# Patient Record
Sex: Male | Born: 1960 | Race: White | Hispanic: No | Marital: Married
Health system: Southern US, Community
[De-identification: ages and names within clinical notes are randomized; demographics above are authoritative.]

---

## 2002-06-17 ENCOUNTER — Ambulatory Visit (HOSPITAL_BASED_OUTPATIENT_CLINIC_OR_DEPARTMENT_OTHER): Admission: RE | Admit: 2002-06-17 | Discharge: 2002-06-17 | Payer: Self-pay | Admitting: Urology

## 2012-01-01 ENCOUNTER — Other Ambulatory Visit: Payer: Self-pay | Admitting: Gastroenterology

## 2013-11-16 ENCOUNTER — Ambulatory Visit (INDEPENDENT_AMBULATORY_CARE_PROVIDER_SITE_OTHER): Payer: BC Managed Care – PPO

## 2013-11-16 ENCOUNTER — Ambulatory Visit (INDEPENDENT_AMBULATORY_CARE_PROVIDER_SITE_OTHER): Payer: BC Managed Care – PPO | Admitting: Podiatry

## 2013-11-16 DIAGNOSIS — M1A1710 Lead-induced chronic gout, right ankle and foot, without tophus (tophi): Secondary | ICD-10-CM

## 2013-11-16 DIAGNOSIS — M7751 Other enthesopathy of right foot: Secondary | ICD-10-CM | POA: Diagnosis not present

## 2013-11-16 DIAGNOSIS — M10371 Gout due to renal impairment, right ankle and foot: Secondary | ICD-10-CM | POA: Diagnosis not present

## 2013-11-16 DIAGNOSIS — M779 Enthesopathy, unspecified: Secondary | ICD-10-CM | POA: Diagnosis not present

## 2013-11-16 DIAGNOSIS — T560X1A Toxic effect of lead and its compounds, accidental (unintentional), initial encounter: Secondary | ICD-10-CM | POA: Diagnosis not present

## 2013-11-16 LAB — C-REACTIVE PROTEIN: CRP: 1.3 mg/dL — ABNORMAL HIGH (ref <0.60)

## 2013-11-16 LAB — URIC ACID: Uric Acid, Serum: 8.5 mg/dL — ABNORMAL HIGH (ref 4.0–7.8)

## 2013-11-16 LAB — RHEUMATOID FACTOR: Rhuematoid fact SerPl-aCnc: <10 IU/mL (ref <=14)

## 2013-11-16 MED ORDER — TRIAMCINOLONE ACETONIDE 10 MG/ML IJ SUSP
10.0000 mg | Freq: Once | INTRAMUSCULAR | Status: AC
  Administered 2013-11-16: 10 mg

## 2013-11-16 MED ORDER — PREDNISONE 10 MG PO TABS: ORAL_TABLET | ORAL | Status: DC

## 2013-11-16 NOTE — Patient Instructions (Signed)

## 2013-11-16 NOTE — Progress Notes (Signed)
Subjective:     Patient ID: Mario Lopez, male   DOB: 01/14/1961, 53 y.o.   MRN: [id number]  Foot Pain   patient states that I have had severe pain in my arch in the outside of my right foot for the last 3 days. I've had numerous small attacks of gout that I use a steroid for this is been very bad   Review of Systems  All other systems reviewed and are negative.      Objective:   Physical Exam  Nursing note and vitals reviewed. Constitutional: He is oriented to person, place, and time.  Cardiovascular: Intact distal pulses.   Musculoskeletal: Normal range of motion.  Neurological: He is oriented to person, place, and time.  Skin: Skin is warm.   neurovascular status intact with muscle strength adequate and range of motion subtalar and midtarsal joint within normal limits. Patient is noted to have discomfort in the outside of the right foot of an intense nature and discomfort in the right arch was swelling noted within the arch itself. I noted redness on the outside of the right foot     Assessment:     Acute gout attack right lateral foot and into the right arch which is extremely painful when palpated    Plan:     H&P and x-ray reviewed. Injected the lateral side of the foot 3 mg Kenalog 5 mg I can Marcaine mixture instructed on anti-inflammatory and placed on 12 a Medrol Dosepak and also ordered blood work to see where the uric acid level is. Discussed that he may ultimately need to see a rheumatologist

## 2013-11-16 NOTE — Progress Notes (Signed)
   Subjective:    Patient ID: Mario Lopez, male    DOB: 02/29/1960, 53 y.o.   MRN: [id number]  HPI Comments: "I have a painful foot"  Patient c/o severe aching dorsal and arch right since Sunday. The foot is red and swollen. Worst is the mornings. He has had episodes like this for the past year. No home treatment.  Foot Pain Associated symptoms include arthralgias.      Review of Systems  Musculoskeletal: Positive for arthralgias and gait problem.  All other systems reviewed and are negative.      Objective:   Physical Exam        Assessment & Plan:

## 2013-11-17 LAB — SEDIMENTATION RATE: Sed Rate: 4 mm/hr (ref 0–16)

## 2013-11-17 LAB — ANA: Anti Nuclear Antibody(ANA): NEGATIVE

## 2013-11-24 ENCOUNTER — Ambulatory Visit (INDEPENDENT_AMBULATORY_CARE_PROVIDER_SITE_OTHER): Payer: BC Managed Care – PPO | Admitting: Podiatry

## 2013-11-24 VITALS — BP 125/78 | HR 74 | Resp 16

## 2013-11-24 DIAGNOSIS — M779 Enthesopathy, unspecified: Secondary | ICD-10-CM

## 2013-11-24 DIAGNOSIS — M10371 Gout due to renal impairment, right ankle and foot: Secondary | ICD-10-CM

## 2013-11-27 NOTE — Progress Notes (Signed)
Subjective:     Patient ID: Mario Lopez, male   DOB: 12/16/1960, 53 y.o.   MRN: [id number]  HPI patient presents stating the outside of my right foot feels quite a bit better and I'm able to ambulate without pain   Review of Systems     Objective:   Physical Exam Neurovascular status intact with muscle strength adequate and no range of motion changes and noted to have improving right lateral foot    Assessment:     Tendinitis of the right lateral foot with probable gout and inflammation    Plan:     Educated again on gout and if packs were to recur we will need to consider long-term medication and today I wrote him out a sheet concerning diet and modification. He will be seen back with any

## 2015-04-25 ENCOUNTER — Telehealth: Payer: Self-pay | Admitting: *Deleted

## 2015-04-25 NOTE — Telephone Encounter (Addendum)
Pt left name, DOB and phone number.  Left message informing pt we would need him to make an appt, since his last visit was 11/2013, and we would need to have his problem and health status updated.  04/26/2015-I spoke with pt and he states he is out-of-town and Dr. Charlsie Merlesegal writes prednisone pack for his gouty ankle when he calls.  Dr. Charlsie Merlesegal ordered refill the Prednisone as previously.  Done and informed pt.

## 2015-04-26 MED ORDER — PREDNISONE 10 MG PO TABS: ORAL_TABLET | ORAL | Status: DC

## 2015-04-26 NOTE — Telephone Encounter (Signed)
That's fine for him to have

## 2015-05-31 DIAGNOSIS — G43A1 Cyclical vomiting, intractable: Secondary | ICD-10-CM | POA: Diagnosis not present

## 2015-05-31 DIAGNOSIS — R1013 Epigastric pain: Secondary | ICD-10-CM | POA: Diagnosis not present

## 2015-05-31 DIAGNOSIS — R197 Diarrhea, unspecified: Secondary | ICD-10-CM | POA: Diagnosis not present

## 2015-06-14 DIAGNOSIS — B2780 Other infectious mononucleosis without complication: Secondary | ICD-10-CM | POA: Diagnosis not present

## 2015-06-14 DIAGNOSIS — J028 Acute pharyngitis due to other specified organisms: Secondary | ICD-10-CM | POA: Diagnosis not present

## 2015-10-23 DIAGNOSIS — Z125 Encounter for screening for malignant neoplasm of prostate: Secondary | ICD-10-CM | POA: Diagnosis not present

## 2015-10-23 DIAGNOSIS — Z0000 Encounter for general adult medical examination without abnormal findings: Secondary | ICD-10-CM | POA: Diagnosis not present

## 2016-04-21 DIAGNOSIS — N183 Chronic kidney disease, stage 3 (moderate): Secondary | ICD-10-CM | POA: Diagnosis not present

## 2016-05-23 DIAGNOSIS — R809 Proteinuria, unspecified: Secondary | ICD-10-CM | POA: Diagnosis not present

## 2016-05-23 DIAGNOSIS — T464X5A Adverse effect of angiotensin-converting-enzyme inhibitors, initial encounter: Secondary | ICD-10-CM | POA: Diagnosis not present

## 2016-05-23 DIAGNOSIS — N183 Chronic kidney disease, stage 3 (moderate): Secondary | ICD-10-CM | POA: Diagnosis not present

## 2016-06-13 ENCOUNTER — Telehealth: Payer: Self-pay | Admitting: Podiatry

## 2016-06-13 ENCOUNTER — Ambulatory Visit (INDEPENDENT_AMBULATORY_CARE_PROVIDER_SITE_OTHER): Payer: BLUE CROSS/BLUE SHIELD | Admitting: Podiatry

## 2016-06-13 DIAGNOSIS — M779 Enthesopathy, unspecified: Secondary | ICD-10-CM

## 2016-06-13 MED ORDER — TRIAMCINOLONE ACETONIDE 10 MG/ML IJ SUSP
10.0000 mg | Freq: Once | INTRAMUSCULAR | Status: AC
  Administered 2016-06-13: 10 mg

## 2016-06-13 MED ORDER — METHYLPREDNISOLONE 4 MG PO TBPK: ORAL_TABLET | ORAL | 0 refills | Status: DC

## 2016-06-13 NOTE — Telephone Encounter (Signed)
Left message informing pt his LOV 11/2013, and we would need for him to make an appt prior to medication refills.

## 2016-06-13 NOTE — Telephone Encounter (Signed)
Pt called and is having a gout flare up and asking if he could get rx refill on his prednisone or does he need an appt. Pt last seen  10.15 2015

## 2016-06-14 NOTE — Progress Notes (Signed)
Subjective:    Patient ID: Mario Lopez, male   DOB: 56 y.o.   MRN: [id number]   HPI patient presents with flareup of his left lateral ankle stating he's been getting this around once a year and is going to [place]    ROS      Objective:  Physical Exam Neurovascular status intact with patient found to have lateral inflammation of the peroneal tendon group that is seems to be where he gets his gout and it is inflamed and tender    Assessment:    Lateral tendinitis secondary to inflammatory condition with pain     Plan:     H&P I went ahead today and I injected the lateral tendon group 3 mg Kenalog 5 mg Xylocaine and instructed on ice therapy and wrote for a five-day

## 2016-06-23 DIAGNOSIS — N183 Chronic kidney disease, stage 3 (moderate): Secondary | ICD-10-CM | POA: Diagnosis not present

## 2016-09-03 ENCOUNTER — Telehealth: Payer: Self-pay | Admitting: Podiatry

## 2016-09-03 MED ORDER — METHYLPREDNISOLONE 4 MG PO TBPK: ORAL_TABLET | ORAL | 0 refills | Status: AC

## 2016-09-03 NOTE — Telephone Encounter (Signed)
Pt called requesting a refill of prednisone for gout.

## 2016-09-03 NOTE — Telephone Encounter (Signed)
Dr. Charlsie Merlesegal ordered refill the Medrol dose pack as previously. Left message with Dr. Beverlee Nimsegal's orders.

## 2016-09-03 NOTE — Telephone Encounter (Signed)
Calling to see if Dr. Charlsie Merlesegal would prescribe additional prednisone as I'm about to go out of the country on a trip in which I will be doing a lot of walking. The prednisone really helps with my gout flair ups/attacks. You can reach me at [phone].

## 2017-01-23 DIAGNOSIS — M25531 Pain in right wrist: Secondary | ICD-10-CM | POA: Diagnosis not present

## 2017-09-04 DIAGNOSIS — M109 Gout, unspecified: Secondary | ICD-10-CM | POA: Diagnosis not present

## 2017-09-04 DIAGNOSIS — M7551 Bursitis of right shoulder: Secondary | ICD-10-CM | POA: Diagnosis not present

## 2017-09-04 DIAGNOSIS — G5601 Carpal tunnel syndrome, right upper limb: Secondary | ICD-10-CM | POA: Diagnosis not present

## 2017-09-04 DIAGNOSIS — G5611 Other lesions of median nerve, right upper limb: Secondary | ICD-10-CM | POA: Diagnosis not present

## 2017-09-25 DIAGNOSIS — M5410 Radiculopathy, site unspecified: Secondary | ICD-10-CM | POA: Diagnosis not present

## 2017-09-25 DIAGNOSIS — M7551 Bursitis of right shoulder: Secondary | ICD-10-CM | POA: Diagnosis not present

## 2017-09-25 DIAGNOSIS — Z6829 Body mass index (BMI) 29.0-29.9, adult: Secondary | ICD-10-CM | POA: Diagnosis not present

## 2017-09-25 DIAGNOSIS — Z23 Encounter for immunization: Secondary | ICD-10-CM | POA: Diagnosis not present

## 2017-11-10 ENCOUNTER — Other Ambulatory Visit: Payer: Self-pay | Admitting: Internal Medicine

## 2017-11-10 DIAGNOSIS — Z125 Encounter for screening for malignant neoplasm of prostate: Secondary | ICD-10-CM | POA: Diagnosis not present

## 2017-11-10 DIAGNOSIS — Z23 Encounter for immunization: Secondary | ICD-10-CM | POA: Diagnosis not present

## 2017-11-10 DIAGNOSIS — R202 Paresthesia of skin: Secondary | ICD-10-CM

## 2017-11-10 DIAGNOSIS — L219 Seborrheic dermatitis, unspecified: Secondary | ICD-10-CM | POA: Diagnosis not present

## 2017-11-10 DIAGNOSIS — N183 Chronic kidney disease, stage 3 (moderate): Secondary | ICD-10-CM | POA: Diagnosis not present

## 2017-11-10 DIAGNOSIS — Z0000 Encounter for general adult medical examination without abnormal findings: Secondary | ICD-10-CM | POA: Diagnosis not present

## 2017-11-10 DIAGNOSIS — Z13220 Encounter for screening for lipoid disorders: Secondary | ICD-10-CM | POA: Diagnosis not present

## 2017-11-12 ENCOUNTER — Ambulatory Visit
Admission: RE | Admit: 2017-11-12 | Discharge: 2017-11-12 | Disposition: A | Discharge status: Home / Self Care | Payer: Self-pay | Source: Ambulatory Visit | Attending: Internal Medicine | Admitting: Internal Medicine

## 2017-11-12 DIAGNOSIS — M542 Cervicalgia: Secondary | ICD-10-CM | POA: Diagnosis not present

## 2017-11-12 DIAGNOSIS — R202 Paresthesia of skin: Secondary | ICD-10-CM

## 2017-12-01 DIAGNOSIS — M5412 Radiculopathy, cervical region: Secondary | ICD-10-CM | POA: Diagnosis not present

## 2017-12-16 DIAGNOSIS — M5412 Radiculopathy, cervical region: Secondary | ICD-10-CM | POA: Diagnosis not present

## 2017-12-23 DIAGNOSIS — M5412 Radiculopathy, cervical region: Secondary | ICD-10-CM | POA: Diagnosis not present

## 2017-12-28 DIAGNOSIS — M5412 Radiculopathy, cervical region: Secondary | ICD-10-CM | POA: Diagnosis not present

## 2018-01-04 DIAGNOSIS — M5412 Radiculopathy, cervical region: Secondary | ICD-10-CM | POA: Diagnosis not present

## 2018-01-18 DIAGNOSIS — M5412 Radiculopathy, cervical region: Secondary | ICD-10-CM | POA: Diagnosis not present

## 2018-01-19 DIAGNOSIS — Z6827 Body mass index (BMI) 27.0-27.9, adult: Secondary | ICD-10-CM | POA: Diagnosis not present

## 2018-01-19 DIAGNOSIS — M542 Cervicalgia: Secondary | ICD-10-CM | POA: Diagnosis not present

## 2018-01-19 DIAGNOSIS — I10 Essential (primary) hypertension: Secondary | ICD-10-CM | POA: Diagnosis not present

## 2020-05-17 ENCOUNTER — Encounter: Payer: Self-pay | Admitting: Podiatry

## 2020-05-17 ENCOUNTER — Other Ambulatory Visit: Payer: Self-pay

## 2020-05-17 ENCOUNTER — Ambulatory Visit (INDEPENDENT_AMBULATORY_CARE_PROVIDER_SITE_OTHER): Payer: BC Managed Care – PPO | Admitting: Podiatry

## 2020-05-17 ENCOUNTER — Ambulatory Visit (INDEPENDENT_AMBULATORY_CARE_PROVIDER_SITE_OTHER): Payer: BC Managed Care – PPO

## 2020-05-17 DIAGNOSIS — M7662 Achilles tendinitis, left leg: Secondary | ICD-10-CM | POA: Diagnosis not present

## 2020-05-17 DIAGNOSIS — M1A0720 Idiopathic chronic gout, left ankle and foot, without tophus (tophi): Secondary | ICD-10-CM | POA: Diagnosis not present

## 2020-05-17 DIAGNOSIS — M722 Plantar fascial fibromatosis: Secondary | ICD-10-CM

## 2020-05-17 MED ORDER — TRIAMCINOLONE ACETONIDE 10 MG/ML IJ SUSP
10.0000 mg | Freq: Once | INTRAMUSCULAR | Status: AC
  Administered 2020-05-17: 10 mg

## 2020-05-17 NOTE — Progress Notes (Signed)
Subjective:   Patient ID: Mario Lopez, male   DOB: 60 y.o.   MRN: [id number]   HPI Patient states that developed a lot of pain in the back of the left heel does not remember specific injury states its been hurting for around 4 weeks and its been sharp and he has had history of gout takes allopurinol 200 mg/day   Review of Systems  All other systems reviewed and are negative.       Objective:  Physical Exam Vitals and nursing note reviewed.  Constitutional:      Appearance: He is well-developed.  Pulmonary:     Effort: Pulmonary effort is normal.  Musculoskeletal:        General: Normal range of motion.  Skin:    General: Skin is warm.  Neurological:     Mental Status: He is alert.     Neurovascular status intact with posterior inflammation around the heel at the insertion mostly on the medial side minimal discomfort lateral mild central pain noted.  Muscle strength was adequate no equinus condition was noted and patient had good range of motion     Assessment:  Difficult to say as to whether or not this may be acute Achilles tendinitis versus gout-like symptomatology     Plan:  H&P x-ray reviewed condition discussed and we reviewed the differences between gout and acute Achilles tendinitis and at this point working to treat conservatively with medications see the results and decide what else may be necessary.  I did sterile prep I injected the medial side Achilles after explaining risk to him with 3 mg Dexasone Kenalog 5 mg Xylocaine he has a boot at home which she will put on and I advised on ice therapy and if symptoms do not get better I want to see him back again  X-rays indicate small posterior spur formation no indication stress fracture arthritis

## 2020-07-01 IMAGING — MR MR CERVICAL SPINE W/O CM
4 of 5 series · 24 of 48 positions shown · non-contrast
Comparison: None.

CLINICAL DATA: Right-sided neck pain with pain and numbness in the
right arm for 3 months

EXAM:
MRI CERVICAL SPINE WITHOUT CONTRAST
TECHNIQUE: Multiplanar, multisequence MR imaging of the cervical spine was
performed. No intravenous contrast was administered.

[Series 3: T2 · sagittal · 3.5mm · 0.43mm/px · 6 of 13 slices shown (1 of 2)]
[im 1/13]
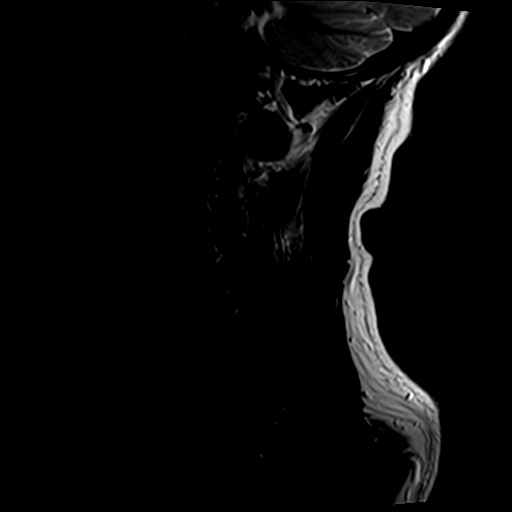
[im 3/13]
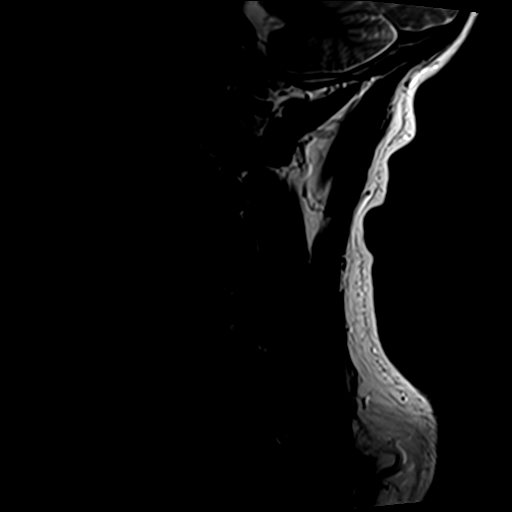
[im 5/13]
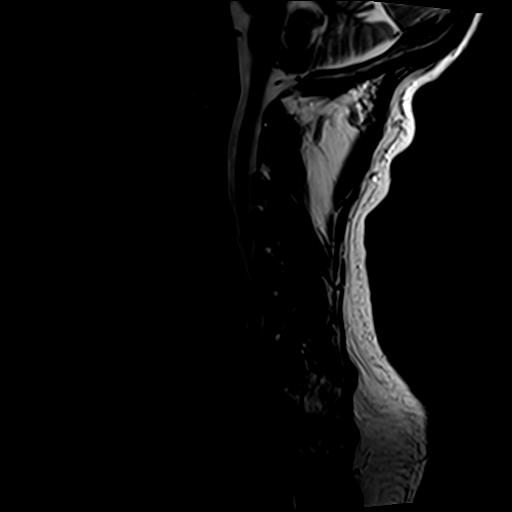
[im 8/13]
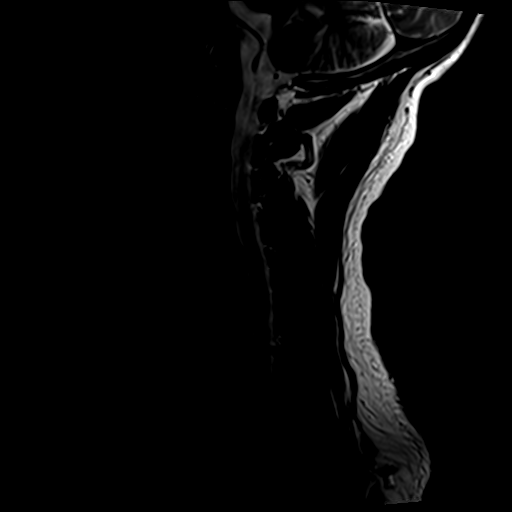
[im 10/13]
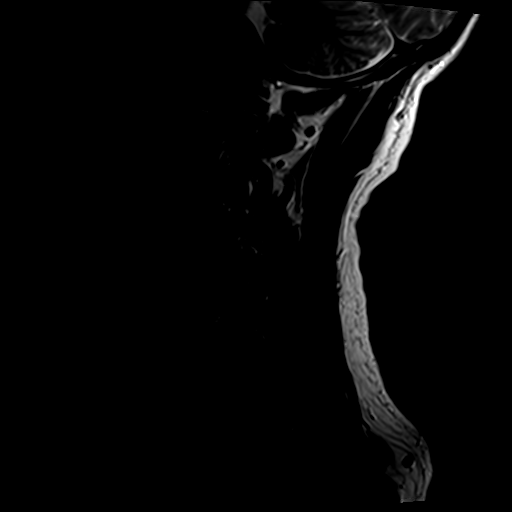
[im 13/13]
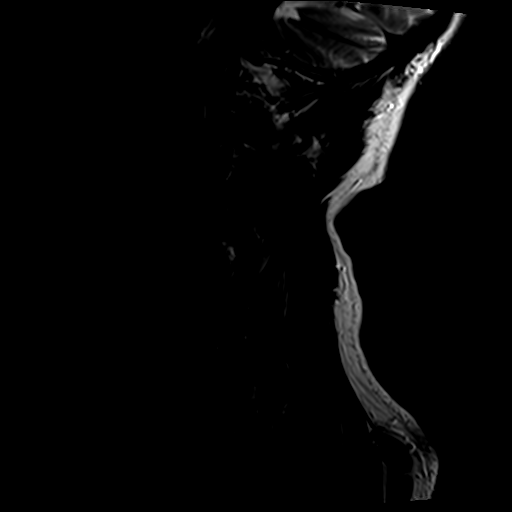

[Series 4: T1 · sagittal · 3.5mm · 0.43mm/px · 7 of 13 slices shown]
[im 1/13]
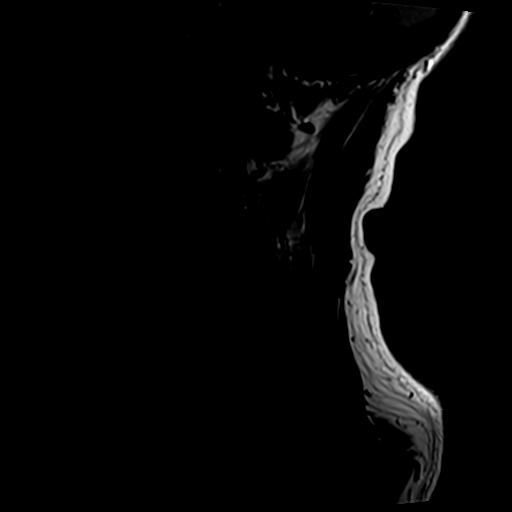
[im 3/13]
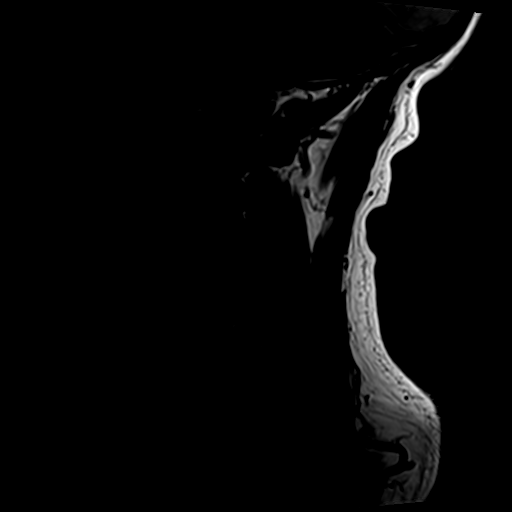
[im 5/13]
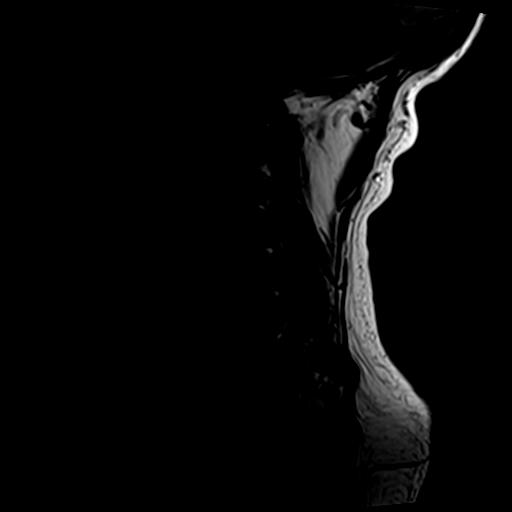
[im 7/13]
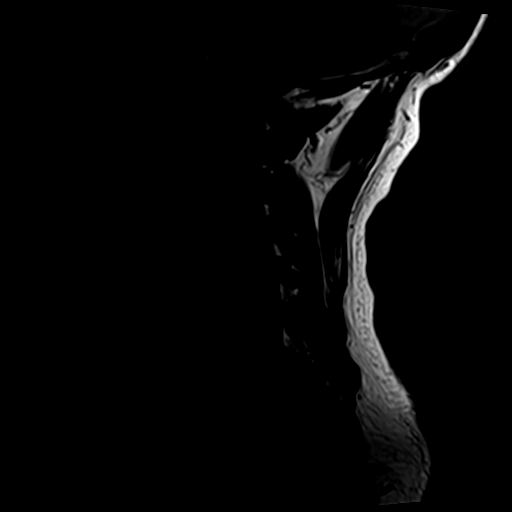
[im 9/13]
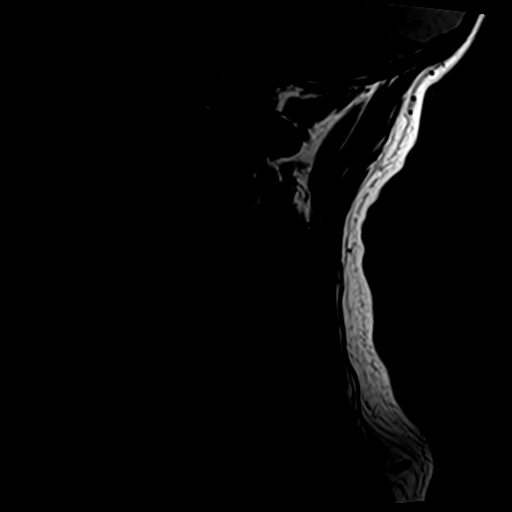
[im 11/13]
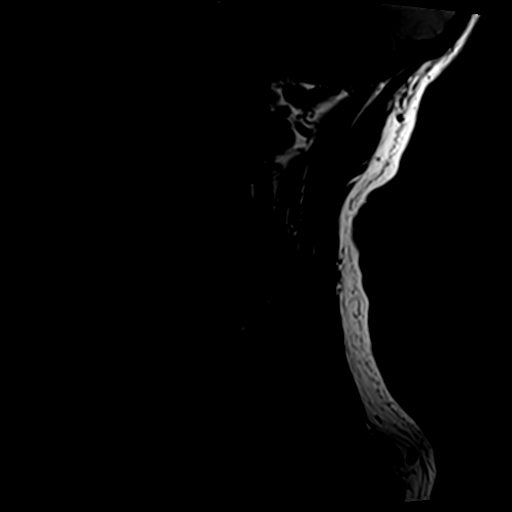
[im 13/13]
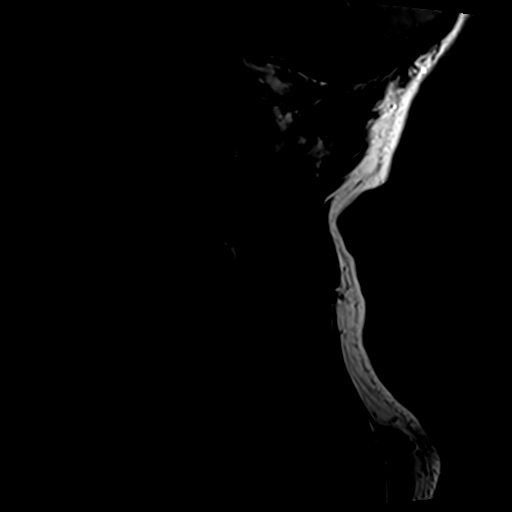

[Series 5: tir sag · sagittal · 3.5mm · 0.43mm/px · 3 of 13 slices shown]
[im 3/13]
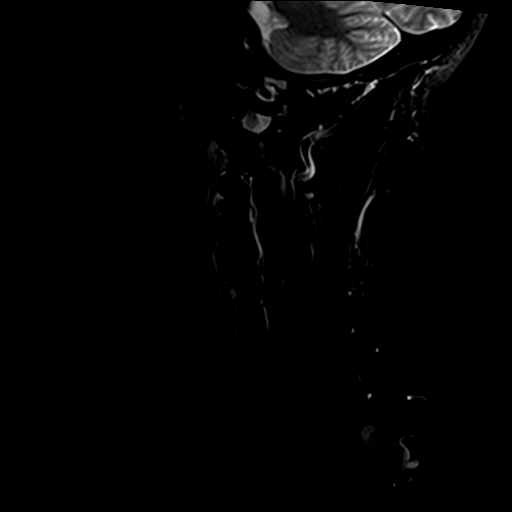
[im 7/13]
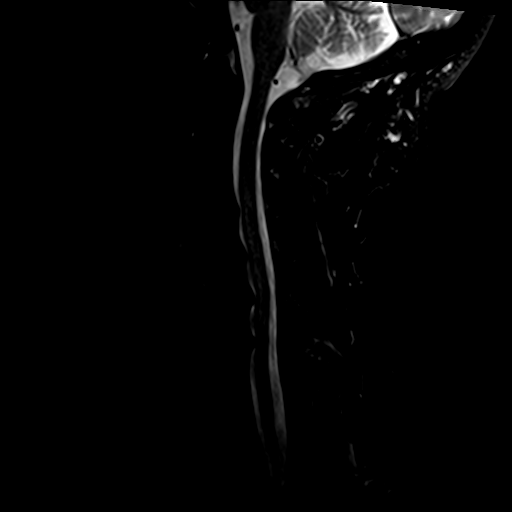
[im 11/13]
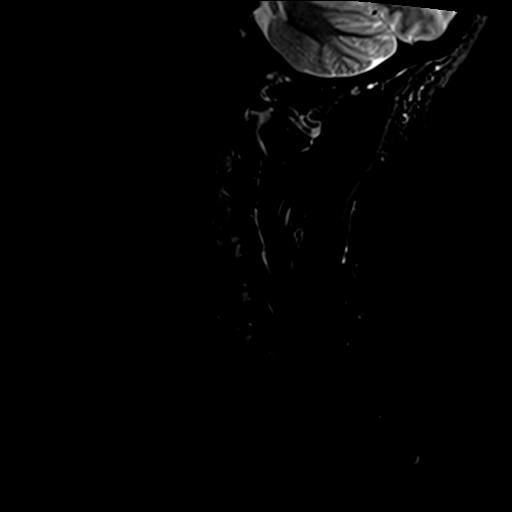

[Series 8: T2 · axial · 3.0mm · 0.70mm/px · z∈[-121,-24]mm · 8 of 28 slices shown (2 of 2)]
[im 1/28]
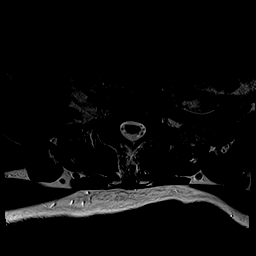
[im 5/28]
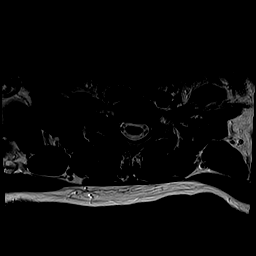
[im 9/28]
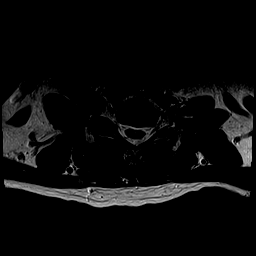
[im 13/28]
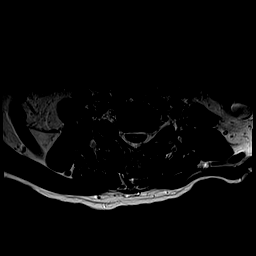
[im 15/28]
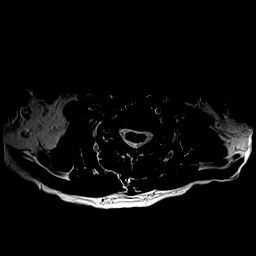
[im 19/28]
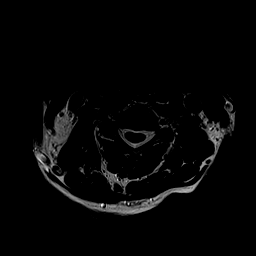
[im 23/28]
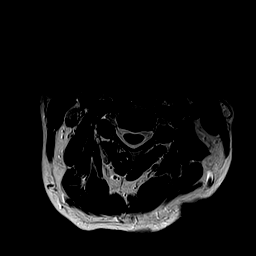
[im 28/28]
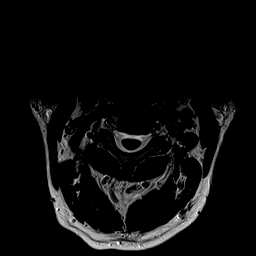

[24 of 48 positions shown; findings below may reference images not displayed]

FINDINGS: Alignment: Degenerative reversal of cervical lordosis.

Vertebrae: No fracture, evidence of discitis, or bone lesion.

Cord: Normal signal and morphology.

Posterior Fossa, vertebral arteries, paraspinal tissues: Negative.

Disc levels:

C2-3: Unremarkable.

C3-4: Shallow central protrusion.  No neural compression.

C4-5: Shallow central protrusion with ventral cord contact but no
compression. Moderate right foraminal narrowing from disc height
loss and uncovertebral ridging.

C5-6: Disc narrowing and bulging with asymmetric right endplate and
uncovertebral ridging. Advanced right foraminal impingement. Left
foraminal narrowing is mild. Spinal stenosis causes mild ventral
cord flattening

C6-7: Disc narrowing with shallow central protrusion that is
downward pointing. Disc contacts the cord without compression.
Patent foramina

C7-T1:Mild right facet spurring
IMPRESSION: 1. On the symptomatic right side there is advanced foraminal
impingement at C5-6 and moderate foraminal narrowing at C4-5 due to
disc height loss and uncovertebral ridging.
2. Disc protrusions contact the ventral cord at multiple levels
without cord compression.
3. Diffusely patent left foramina.

## 2022-04-15 DIAGNOSIS — S80812A Abrasion, left lower leg, initial encounter: Secondary | ICD-10-CM | POA: Diagnosis not present

## 2022-04-15 DIAGNOSIS — L570 Actinic keratosis: Secondary | ICD-10-CM | POA: Diagnosis not present

## 2022-04-15 DIAGNOSIS — L218 Other seborrheic dermatitis: Secondary | ICD-10-CM | POA: Diagnosis not present

## 2022-04-15 DIAGNOSIS — D225 Melanocytic nevi of trunk: Secondary | ICD-10-CM | POA: Diagnosis not present

## 2022-07-21 DIAGNOSIS — M25561 Pain in right knee: Secondary | ICD-10-CM | POA: Diagnosis not present

## 2022-09-10 DIAGNOSIS — M25511 Pain in right shoulder: Secondary | ICD-10-CM | POA: Diagnosis not present

## 2022-10-14 DIAGNOSIS — M5412 Radiculopathy, cervical region: Secondary | ICD-10-CM | POA: Diagnosis not present

## 2022-10-14 DIAGNOSIS — M542 Cervicalgia: Secondary | ICD-10-CM | POA: Diagnosis not present

## 2022-10-15 DIAGNOSIS — M542 Cervicalgia: Secondary | ICD-10-CM | POA: Diagnosis not present

## 2022-10-15 DIAGNOSIS — M25511 Pain in right shoulder: Secondary | ICD-10-CM | POA: Diagnosis not present

## 2022-10-15 DIAGNOSIS — M25611 Stiffness of right shoulder, not elsewhere classified: Secondary | ICD-10-CM | POA: Diagnosis not present

## 2022-10-17 DIAGNOSIS — M25511 Pain in right shoulder: Secondary | ICD-10-CM | POA: Diagnosis not present

## 2022-10-17 DIAGNOSIS — M25611 Stiffness of right shoulder, not elsewhere classified: Secondary | ICD-10-CM | POA: Diagnosis not present

## 2022-10-17 DIAGNOSIS — M542 Cervicalgia: Secondary | ICD-10-CM | POA: Diagnosis not present

## 2022-10-20 DIAGNOSIS — M25611 Stiffness of right shoulder, not elsewhere classified: Secondary | ICD-10-CM | POA: Diagnosis not present

## 2022-10-20 DIAGNOSIS — M25511 Pain in right shoulder: Secondary | ICD-10-CM | POA: Diagnosis not present

## 2022-10-20 DIAGNOSIS — M542 Cervicalgia: Secondary | ICD-10-CM | POA: Diagnosis not present

## 2022-10-24 DIAGNOSIS — M25511 Pain in right shoulder: Secondary | ICD-10-CM | POA: Diagnosis not present

## 2022-10-24 DIAGNOSIS — M542 Cervicalgia: Secondary | ICD-10-CM | POA: Diagnosis not present

## 2022-10-24 DIAGNOSIS — M25611 Stiffness of right shoulder, not elsewhere classified: Secondary | ICD-10-CM | POA: Diagnosis not present

## 2022-10-27 DIAGNOSIS — M25511 Pain in right shoulder: Secondary | ICD-10-CM | POA: Diagnosis not present

## 2022-10-27 DIAGNOSIS — M25611 Stiffness of right shoulder, not elsewhere classified: Secondary | ICD-10-CM | POA: Diagnosis not present

## 2022-10-27 DIAGNOSIS — M542 Cervicalgia: Secondary | ICD-10-CM | POA: Diagnosis not present

## 2022-10-30 DIAGNOSIS — M25511 Pain in right shoulder: Secondary | ICD-10-CM | POA: Diagnosis not present

## 2022-10-30 DIAGNOSIS — M25611 Stiffness of right shoulder, not elsewhere classified: Secondary | ICD-10-CM | POA: Diagnosis not present

## 2022-10-30 DIAGNOSIS — M542 Cervicalgia: Secondary | ICD-10-CM | POA: Diagnosis not present

## 2022-11-03 DIAGNOSIS — M542 Cervicalgia: Secondary | ICD-10-CM | POA: Diagnosis not present

## 2022-11-03 DIAGNOSIS — M25511 Pain in right shoulder: Secondary | ICD-10-CM | POA: Diagnosis not present

## 2022-11-03 DIAGNOSIS — M25611 Stiffness of right shoulder, not elsewhere classified: Secondary | ICD-10-CM | POA: Diagnosis not present

## 2022-11-06 DIAGNOSIS — M542 Cervicalgia: Secondary | ICD-10-CM | POA: Diagnosis not present

## 2022-11-06 DIAGNOSIS — M25511 Pain in right shoulder: Secondary | ICD-10-CM | POA: Diagnosis not present

## 2022-11-06 DIAGNOSIS — M25611 Stiffness of right shoulder, not elsewhere classified: Secondary | ICD-10-CM | POA: Diagnosis not present

## 2022-12-02 DIAGNOSIS — M5412 Radiculopathy, cervical region: Secondary | ICD-10-CM | POA: Diagnosis not present

## 2022-12-11 DIAGNOSIS — M542 Cervicalgia: Secondary | ICD-10-CM | POA: Diagnosis not present

## 2022-12-11 DIAGNOSIS — M5412 Radiculopathy, cervical region: Secondary | ICD-10-CM | POA: Diagnosis not present

## 2022-12-12 DIAGNOSIS — M542 Cervicalgia: Secondary | ICD-10-CM | POA: Diagnosis not present

## 2022-12-12 DIAGNOSIS — M5412 Radiculopathy, cervical region: Secondary | ICD-10-CM | POA: Diagnosis not present

## 2022-12-12 DIAGNOSIS — M2569 Stiffness of other specified joint, not elsewhere classified: Secondary | ICD-10-CM | POA: Diagnosis not present

## 2022-12-15 DIAGNOSIS — M2569 Stiffness of other specified joint, not elsewhere classified: Secondary | ICD-10-CM | POA: Diagnosis not present

## 2022-12-15 DIAGNOSIS — M5412 Radiculopathy, cervical region: Secondary | ICD-10-CM | POA: Diagnosis not present

## 2022-12-15 DIAGNOSIS — M542 Cervicalgia: Secondary | ICD-10-CM | POA: Diagnosis not present

## 2022-12-16 DIAGNOSIS — M542 Cervicalgia: Secondary | ICD-10-CM | POA: Diagnosis not present

## 2022-12-16 DIAGNOSIS — M2569 Stiffness of other specified joint, not elsewhere classified: Secondary | ICD-10-CM | POA: Diagnosis not present

## 2022-12-16 DIAGNOSIS — M5412 Radiculopathy, cervical region: Secondary | ICD-10-CM | POA: Diagnosis not present

## 2022-12-25 DIAGNOSIS — M542 Cervicalgia: Secondary | ICD-10-CM | POA: Diagnosis not present

## 2022-12-25 DIAGNOSIS — M2569 Stiffness of other specified joint, not elsewhere classified: Secondary | ICD-10-CM | POA: Diagnosis not present

## 2022-12-25 DIAGNOSIS — M5412 Radiculopathy, cervical region: Secondary | ICD-10-CM | POA: Diagnosis not present

## 2022-12-29 DIAGNOSIS — M5412 Radiculopathy, cervical region: Secondary | ICD-10-CM | POA: Diagnosis not present

## 2023-01-06 DIAGNOSIS — M5412 Radiculopathy, cervical region: Secondary | ICD-10-CM | POA: Diagnosis not present

## 2023-01-22 DIAGNOSIS — Z23 Encounter for immunization: Secondary | ICD-10-CM | POA: Diagnosis not present

## 2023-01-22 DIAGNOSIS — Z125 Encounter for screening for malignant neoplasm of prostate: Secondary | ICD-10-CM | POA: Diagnosis not present

## 2023-01-22 DIAGNOSIS — R809 Proteinuria, unspecified: Secondary | ICD-10-CM | POA: Diagnosis not present

## 2023-01-22 DIAGNOSIS — N183 Chronic kidney disease, stage 3 unspecified: Secondary | ICD-10-CM | POA: Diagnosis not present

## 2023-01-22 DIAGNOSIS — M1711 Unilateral primary osteoarthritis, right knee: Secondary | ICD-10-CM | POA: Diagnosis not present

## 2023-01-22 DIAGNOSIS — E7800 Pure hypercholesterolemia, unspecified: Secondary | ICD-10-CM | POA: Diagnosis not present

## 2023-01-22 DIAGNOSIS — M5412 Radiculopathy, cervical region: Secondary | ICD-10-CM | POA: Diagnosis not present

## 2023-01-22 DIAGNOSIS — Z0000 Encounter for general adult medical examination without abnormal findings: Secondary | ICD-10-CM | POA: Diagnosis not present

## 2023-01-23 DIAGNOSIS — M25561 Pain in right knee: Secondary | ICD-10-CM | POA: Diagnosis not present

## 2023-02-13 DIAGNOSIS — M25561 Pain in right knee: Secondary | ICD-10-CM | POA: Diagnosis not present

## 2023-02-19 DIAGNOSIS — M1711 Unilateral primary osteoarthritis, right knee: Secondary | ICD-10-CM | POA: Diagnosis not present

## 2023-02-25 DIAGNOSIS — Z96651 Presence of right artificial knee joint: Secondary | ICD-10-CM | POA: Diagnosis not present

## 2023-02-25 DIAGNOSIS — G8918 Other acute postprocedural pain: Secondary | ICD-10-CM | POA: Diagnosis not present

## 2023-02-25 DIAGNOSIS — M1712 Unilateral primary osteoarthritis, left knee: Secondary | ICD-10-CM | POA: Diagnosis not present

## 2023-02-25 DIAGNOSIS — M1711 Unilateral primary osteoarthritis, right knee: Secondary | ICD-10-CM | POA: Diagnosis not present

## 2023-02-27 DIAGNOSIS — Z96651 Presence of right artificial knee joint: Secondary | ICD-10-CM | POA: Diagnosis not present

## 2023-02-27 DIAGNOSIS — R531 Weakness: Secondary | ICD-10-CM | POA: Diagnosis not present

## 2023-02-27 DIAGNOSIS — R262 Difficulty in walking, not elsewhere classified: Secondary | ICD-10-CM | POA: Diagnosis not present

## 2023-02-27 DIAGNOSIS — M25669 Stiffness of unspecified knee, not elsewhere classified: Secondary | ICD-10-CM | POA: Diagnosis not present

## 2023-03-03 DIAGNOSIS — Z96651 Presence of right artificial knee joint: Secondary | ICD-10-CM | POA: Diagnosis not present

## 2023-03-03 DIAGNOSIS — R262 Difficulty in walking, not elsewhere classified: Secondary | ICD-10-CM | POA: Diagnosis not present

## 2023-03-03 DIAGNOSIS — M25669 Stiffness of unspecified knee, not elsewhere classified: Secondary | ICD-10-CM | POA: Diagnosis not present

## 2023-03-03 DIAGNOSIS — R531 Weakness: Secondary | ICD-10-CM | POA: Diagnosis not present

## 2023-03-05 DIAGNOSIS — M25669 Stiffness of unspecified knee, not elsewhere classified: Secondary | ICD-10-CM | POA: Diagnosis not present

## 2023-03-05 DIAGNOSIS — R531 Weakness: Secondary | ICD-10-CM | POA: Diagnosis not present

## 2023-03-05 DIAGNOSIS — Z96651 Presence of right artificial knee joint: Secondary | ICD-10-CM | POA: Diagnosis not present

## 2023-03-05 DIAGNOSIS — R262 Difficulty in walking, not elsewhere classified: Secondary | ICD-10-CM | POA: Diagnosis not present

## 2023-03-06 DIAGNOSIS — Z96651 Presence of right artificial knee joint: Secondary | ICD-10-CM | POA: Diagnosis not present

## 2023-03-06 DIAGNOSIS — R531 Weakness: Secondary | ICD-10-CM | POA: Diagnosis not present

## 2023-03-06 DIAGNOSIS — R262 Difficulty in walking, not elsewhere classified: Secondary | ICD-10-CM | POA: Diagnosis not present

## 2023-03-06 DIAGNOSIS — M25669 Stiffness of unspecified knee, not elsewhere classified: Secondary | ICD-10-CM | POA: Diagnosis not present

## 2023-03-09 DIAGNOSIS — R262 Difficulty in walking, not elsewhere classified: Secondary | ICD-10-CM | POA: Diagnosis not present

## 2023-03-09 DIAGNOSIS — Z96651 Presence of right artificial knee joint: Secondary | ICD-10-CM | POA: Diagnosis not present

## 2023-03-09 DIAGNOSIS — R531 Weakness: Secondary | ICD-10-CM | POA: Diagnosis not present

## 2023-03-09 DIAGNOSIS — M25669 Stiffness of unspecified knee, not elsewhere classified: Secondary | ICD-10-CM | POA: Diagnosis not present

## 2023-03-11 DIAGNOSIS — M1711 Unilateral primary osteoarthritis, right knee: Secondary | ICD-10-CM | POA: Diagnosis not present

## 2023-03-12 DIAGNOSIS — M25669 Stiffness of unspecified knee, not elsewhere classified: Secondary | ICD-10-CM | POA: Diagnosis not present

## 2023-03-12 DIAGNOSIS — Z96651 Presence of right artificial knee joint: Secondary | ICD-10-CM | POA: Diagnosis not present

## 2023-03-12 DIAGNOSIS — R531 Weakness: Secondary | ICD-10-CM | POA: Diagnosis not present

## 2023-03-12 DIAGNOSIS — R262 Difficulty in walking, not elsewhere classified: Secondary | ICD-10-CM | POA: Diagnosis not present

## 2023-03-13 DIAGNOSIS — M25669 Stiffness of unspecified knee, not elsewhere classified: Secondary | ICD-10-CM | POA: Diagnosis not present

## 2023-03-13 DIAGNOSIS — R262 Difficulty in walking, not elsewhere classified: Secondary | ICD-10-CM | POA: Diagnosis not present

## 2023-03-13 DIAGNOSIS — Z96651 Presence of right artificial knee joint: Secondary | ICD-10-CM | POA: Diagnosis not present

## 2023-03-13 DIAGNOSIS — R531 Weakness: Secondary | ICD-10-CM | POA: Diagnosis not present

## 2023-03-16 DIAGNOSIS — R262 Difficulty in walking, not elsewhere classified: Secondary | ICD-10-CM | POA: Diagnosis not present

## 2023-03-16 DIAGNOSIS — Z96651 Presence of right artificial knee joint: Secondary | ICD-10-CM | POA: Diagnosis not present

## 2023-03-16 DIAGNOSIS — R531 Weakness: Secondary | ICD-10-CM | POA: Diagnosis not present

## 2023-03-16 DIAGNOSIS — M25669 Stiffness of unspecified knee, not elsewhere classified: Secondary | ICD-10-CM | POA: Diagnosis not present

## 2023-03-18 DIAGNOSIS — R262 Difficulty in walking, not elsewhere classified: Secondary | ICD-10-CM | POA: Diagnosis not present

## 2023-03-18 DIAGNOSIS — R531 Weakness: Secondary | ICD-10-CM | POA: Diagnosis not present

## 2023-03-18 DIAGNOSIS — Z96651 Presence of right artificial knee joint: Secondary | ICD-10-CM | POA: Diagnosis not present

## 2023-03-18 DIAGNOSIS — M25669 Stiffness of unspecified knee, not elsewhere classified: Secondary | ICD-10-CM | POA: Diagnosis not present

## 2023-03-20 DIAGNOSIS — R262 Difficulty in walking, not elsewhere classified: Secondary | ICD-10-CM | POA: Diagnosis not present

## 2023-03-20 DIAGNOSIS — R531 Weakness: Secondary | ICD-10-CM | POA: Diagnosis not present

## 2023-03-20 DIAGNOSIS — M25669 Stiffness of unspecified knee, not elsewhere classified: Secondary | ICD-10-CM | POA: Diagnosis not present

## 2023-03-20 DIAGNOSIS — Z96651 Presence of right artificial knee joint: Secondary | ICD-10-CM | POA: Diagnosis not present

## 2023-03-23 DIAGNOSIS — R531 Weakness: Secondary | ICD-10-CM | POA: Diagnosis not present

## 2023-03-23 DIAGNOSIS — R262 Difficulty in walking, not elsewhere classified: Secondary | ICD-10-CM | POA: Diagnosis not present

## 2023-03-23 DIAGNOSIS — M25669 Stiffness of unspecified knee, not elsewhere classified: Secondary | ICD-10-CM | POA: Diagnosis not present

## 2023-03-23 DIAGNOSIS — Z96651 Presence of right artificial knee joint: Secondary | ICD-10-CM | POA: Diagnosis not present

## 2023-03-25 DIAGNOSIS — Z96651 Presence of right artificial knee joint: Secondary | ICD-10-CM | POA: Diagnosis not present

## 2023-03-25 DIAGNOSIS — R262 Difficulty in walking, not elsewhere classified: Secondary | ICD-10-CM | POA: Diagnosis not present

## 2023-03-25 DIAGNOSIS — M25669 Stiffness of unspecified knee, not elsewhere classified: Secondary | ICD-10-CM | POA: Diagnosis not present

## 2023-03-25 DIAGNOSIS — R531 Weakness: Secondary | ICD-10-CM | POA: Diagnosis not present

## 2023-03-27 DIAGNOSIS — M25669 Stiffness of unspecified knee, not elsewhere classified: Secondary | ICD-10-CM | POA: Diagnosis not present

## 2023-03-27 DIAGNOSIS — R262 Difficulty in walking, not elsewhere classified: Secondary | ICD-10-CM | POA: Diagnosis not present

## 2023-03-27 DIAGNOSIS — R531 Weakness: Secondary | ICD-10-CM | POA: Diagnosis not present

## 2023-03-27 DIAGNOSIS — Z96651 Presence of right artificial knee joint: Secondary | ICD-10-CM | POA: Diagnosis not present

## 2023-03-30 DIAGNOSIS — R262 Difficulty in walking, not elsewhere classified: Secondary | ICD-10-CM | POA: Diagnosis not present

## 2023-03-30 DIAGNOSIS — M25669 Stiffness of unspecified knee, not elsewhere classified: Secondary | ICD-10-CM | POA: Diagnosis not present

## 2023-03-30 DIAGNOSIS — R531 Weakness: Secondary | ICD-10-CM | POA: Diagnosis not present

## 2023-03-30 DIAGNOSIS — Z96651 Presence of right artificial knee joint: Secondary | ICD-10-CM | POA: Diagnosis not present

## 2023-04-01 DIAGNOSIS — R262 Difficulty in walking, not elsewhere classified: Secondary | ICD-10-CM | POA: Diagnosis not present

## 2023-04-01 DIAGNOSIS — Z96651 Presence of right artificial knee joint: Secondary | ICD-10-CM | POA: Diagnosis not present

## 2023-04-01 DIAGNOSIS — R531 Weakness: Secondary | ICD-10-CM | POA: Diagnosis not present

## 2023-04-01 DIAGNOSIS — M25669 Stiffness of unspecified knee, not elsewhere classified: Secondary | ICD-10-CM | POA: Diagnosis not present

## 2023-04-03 DIAGNOSIS — Z96651 Presence of right artificial knee joint: Secondary | ICD-10-CM | POA: Diagnosis not present

## 2023-04-03 DIAGNOSIS — R262 Difficulty in walking, not elsewhere classified: Secondary | ICD-10-CM | POA: Diagnosis not present

## 2023-04-03 DIAGNOSIS — R531 Weakness: Secondary | ICD-10-CM | POA: Diagnosis not present

## 2023-04-03 DIAGNOSIS — M25669 Stiffness of unspecified knee, not elsewhere classified: Secondary | ICD-10-CM | POA: Diagnosis not present

## 2023-04-06 DIAGNOSIS — R262 Difficulty in walking, not elsewhere classified: Secondary | ICD-10-CM | POA: Diagnosis not present

## 2023-04-06 DIAGNOSIS — M25669 Stiffness of unspecified knee, not elsewhere classified: Secondary | ICD-10-CM | POA: Diagnosis not present

## 2023-04-06 DIAGNOSIS — R531 Weakness: Secondary | ICD-10-CM | POA: Diagnosis not present

## 2023-04-06 DIAGNOSIS — Z96651 Presence of right artificial knee joint: Secondary | ICD-10-CM | POA: Diagnosis not present

## 2023-04-09 DIAGNOSIS — R262 Difficulty in walking, not elsewhere classified: Secondary | ICD-10-CM | POA: Diagnosis not present

## 2023-04-09 DIAGNOSIS — M25669 Stiffness of unspecified knee, not elsewhere classified: Secondary | ICD-10-CM | POA: Diagnosis not present

## 2023-04-09 DIAGNOSIS — R531 Weakness: Secondary | ICD-10-CM | POA: Diagnosis not present

## 2023-04-09 DIAGNOSIS — Z96651 Presence of right artificial knee joint: Secondary | ICD-10-CM | POA: Diagnosis not present

## 2023-04-13 DIAGNOSIS — M25669 Stiffness of unspecified knee, not elsewhere classified: Secondary | ICD-10-CM | POA: Diagnosis not present

## 2023-04-13 DIAGNOSIS — Z96651 Presence of right artificial knee joint: Secondary | ICD-10-CM | POA: Diagnosis not present

## 2023-04-13 DIAGNOSIS — R531 Weakness: Secondary | ICD-10-CM | POA: Diagnosis not present

## 2023-04-13 DIAGNOSIS — R262 Difficulty in walking, not elsewhere classified: Secondary | ICD-10-CM | POA: Diagnosis not present

## 2023-04-15 DIAGNOSIS — M25669 Stiffness of unspecified knee, not elsewhere classified: Secondary | ICD-10-CM | POA: Diagnosis not present

## 2023-04-15 DIAGNOSIS — R531 Weakness: Secondary | ICD-10-CM | POA: Diagnosis not present

## 2023-04-15 DIAGNOSIS — Z96651 Presence of right artificial knee joint: Secondary | ICD-10-CM | POA: Diagnosis not present

## 2023-04-15 DIAGNOSIS — R262 Difficulty in walking, not elsewhere classified: Secondary | ICD-10-CM | POA: Diagnosis not present

## 2023-04-28 DIAGNOSIS — R262 Difficulty in walking, not elsewhere classified: Secondary | ICD-10-CM | POA: Diagnosis not present

## 2023-04-28 DIAGNOSIS — M25669 Stiffness of unspecified knee, not elsewhere classified: Secondary | ICD-10-CM | POA: Diagnosis not present

## 2023-04-28 DIAGNOSIS — Z96651 Presence of right artificial knee joint: Secondary | ICD-10-CM | POA: Diagnosis not present

## 2023-04-28 DIAGNOSIS — R531 Weakness: Secondary | ICD-10-CM | POA: Diagnosis not present

## 2023-04-29 DIAGNOSIS — R262 Difficulty in walking, not elsewhere classified: Secondary | ICD-10-CM | POA: Diagnosis not present

## 2023-04-29 DIAGNOSIS — Z96651 Presence of right artificial knee joint: Secondary | ICD-10-CM | POA: Diagnosis not present

## 2023-04-29 DIAGNOSIS — M25669 Stiffness of unspecified knee, not elsewhere classified: Secondary | ICD-10-CM | POA: Diagnosis not present

## 2023-04-29 DIAGNOSIS — R531 Weakness: Secondary | ICD-10-CM | POA: Diagnosis not present

## 2023-06-05 DIAGNOSIS — M1711 Unilateral primary osteoarthritis, right knee: Secondary | ICD-10-CM | POA: Diagnosis not present

## 2023-07-28 DIAGNOSIS — M1711 Unilateral primary osteoarthritis, right knee: Secondary | ICD-10-CM | POA: Diagnosis not present

## 2023-08-12 DIAGNOSIS — M25561 Pain in right knee: Secondary | ICD-10-CM | POA: Diagnosis not present

## 2023-09-02 DIAGNOSIS — M25561 Pain in right knee: Secondary | ICD-10-CM | POA: Diagnosis not present

## 2023-10-02 DIAGNOSIS — M25552 Pain in left hip: Secondary | ICD-10-CM | POA: Diagnosis not present

## 2023-10-02 DIAGNOSIS — M25561 Pain in right knee: Secondary | ICD-10-CM | POA: Diagnosis not present

## 2023-10-20 DIAGNOSIS — M65341 Trigger finger, right ring finger: Secondary | ICD-10-CM | POA: Diagnosis not present

## 2023-11-02 DIAGNOSIS — R2689 Other abnormalities of gait and mobility: Secondary | ICD-10-CM | POA: Diagnosis not present

## 2023-11-02 DIAGNOSIS — M6281 Muscle weakness (generalized): Secondary | ICD-10-CM | POA: Diagnosis not present

## 2023-11-11 DIAGNOSIS — M25552 Pain in left hip: Secondary | ICD-10-CM | POA: Diagnosis not present

## 2023-11-11 DIAGNOSIS — M6281 Muscle weakness (generalized): Secondary | ICD-10-CM | POA: Diagnosis not present

## 2023-11-11 DIAGNOSIS — R2689 Other abnormalities of gait and mobility: Secondary | ICD-10-CM | POA: Diagnosis not present

## 2023-11-13 DIAGNOSIS — R2689 Other abnormalities of gait and mobility: Secondary | ICD-10-CM | POA: Diagnosis not present

## 2023-11-13 DIAGNOSIS — M25552 Pain in left hip: Secondary | ICD-10-CM | POA: Diagnosis not present

## 2023-11-13 DIAGNOSIS — M6281 Muscle weakness (generalized): Secondary | ICD-10-CM | POA: Diagnosis not present

## 2023-11-18 DIAGNOSIS — M25552 Pain in left hip: Secondary | ICD-10-CM | POA: Diagnosis not present

## 2023-11-18 DIAGNOSIS — M6281 Muscle weakness (generalized): Secondary | ICD-10-CM | POA: Diagnosis not present

## 2023-11-18 DIAGNOSIS — R2689 Other abnormalities of gait and mobility: Secondary | ICD-10-CM | POA: Diagnosis not present

## 2023-11-23 DIAGNOSIS — M19012 Primary osteoarthritis, left shoulder: Secondary | ICD-10-CM | POA: Diagnosis not present

## 2023-11-24 DIAGNOSIS — M25552 Pain in left hip: Secondary | ICD-10-CM | POA: Diagnosis not present

## 2023-11-24 DIAGNOSIS — R2689 Other abnormalities of gait and mobility: Secondary | ICD-10-CM | POA: Diagnosis not present

## 2023-11-24 DIAGNOSIS — M6281 Muscle weakness (generalized): Secondary | ICD-10-CM | POA: Diagnosis not present

## 2023-12-08 DIAGNOSIS — M25552 Pain in left hip: Secondary | ICD-10-CM | POA: Diagnosis not present

## 2023-12-08 DIAGNOSIS — R2689 Other abnormalities of gait and mobility: Secondary | ICD-10-CM | POA: Diagnosis not present

## 2023-12-08 DIAGNOSIS — M6281 Muscle weakness (generalized): Secondary | ICD-10-CM | POA: Diagnosis not present
